# Patient Record
Sex: Female | Born: 1990 | Race: Black or African American | Hispanic: No | Marital: Single | Smoking: Never smoker
Health system: Southern US, Community
[De-identification: ages and names within clinical notes are randomized; demographics above are authoritative.]

## PROBLEM LIST (undated history)

## (undated) ENCOUNTER — Emergency Department (HOSPITAL_COMMUNITY): Discharge status: Absent without leave | Payer: Self-pay

## (undated) DIAGNOSIS — D649 Anemia, unspecified: Secondary | ICD-10-CM

---

## 2012-06-05 ENCOUNTER — Emergency Department (HOSPITAL_COMMUNITY)

## 2012-06-05 ENCOUNTER — Encounter (HOSPITAL_COMMUNITY): Payer: Self-pay | Admitting: *Deleted

## 2012-06-05 ENCOUNTER — Emergency Department (HOSPITAL_COMMUNITY)
Admission: EM | Admit: 2012-06-05 | Discharge: 2012-06-05 | Disposition: A | Discharge status: Home / Self Care | Attending: Emergency Medicine | Admitting: Emergency Medicine

## 2012-06-05 DIAGNOSIS — M6580 Other synovitis and tenosynovitis, unspecified site: Secondary | ICD-10-CM | POA: Insufficient documentation

## 2012-06-05 DIAGNOSIS — M76892 Other specified enthesopathies of left lower limb, excluding foot: Secondary | ICD-10-CM

## 2012-06-05 MED ORDER — NAPROXEN 500 MG PO TABS: 500.0000 mg | ORAL_TABLET | Freq: Two times a day (BID) | ORAL | Status: AC

## 2012-06-05 NOTE — Progress Notes (Signed)
Orthopedic Tech Progress Note Patient Details:  [place] 07-05-1991 [id number]  Ortho Devices Type of Ortho Device: Knee Sleeve Ortho Device/Splint Location: (L) LE Ortho Device/Splint Interventions: Application   Jennye Moccasin 06/05/2012, 4:45 PM

## 2012-06-05 NOTE — ED Notes (Signed)
Pt reports left knee pain since Saturday, denies any injury. Reports she works out a lot. ambulatory in triage.

## 2012-06-05 NOTE — ED Notes (Signed)
NAD upon discharge home. Discharge instructions reviewed, patient verbalizes understanding.

## 2012-06-05 NOTE — ED Provider Notes (Signed)
History   This chart was scribed for Judith Kras, MD by Charolett Bumpers . The patient was seen in room TR08C/TR08C. Patient's care was started at 1522.    CSN: [id number]  Arrival date & time 06/05/12  1434   First MD Initiated Contact with Patient 06/05/12 1522      Chief Complaint  Patient presents with  . Knee Pain    (Consider location/radiation/quality/duration/timing/severity/associated sxs/prior treatment) HPI Judith Bates is a 21 y.o. female who presents to the Emergency Department complaining of constant, moderate left knee pain that started hurting 4 days ago while working out. Pt denies any falls or known injuries. Pt reports that she took Ibuprofen with no relief yesterday. Pt denies any prior hx of knee pain or problems.   History reviewed. No pertinent past medical history.  History reviewed. No pertinent past surgical history.  History reviewed. No pertinent family history.  History  Substance Use Topics  . Smoking status: Never Smoker   . Smokeless tobacco: Not on file  . Alcohol Use: No    OB History    Grav Para Term Preterm Abortions TAB SAB Ect Mult Living                  Review of Systems  Constitutional: Negative for fever and chills.  Respiratory: Negative for shortness of breath.   Gastrointestinal: Negative for nausea and vomiting.  Musculoskeletal: Positive for arthralgias.       Left knee pain.   Neurological: Negative for weakness.  All other systems reviewed and are negative.    Allergies  Review of patient's allergies indicates no known allergies.  Home Medications   Current Outpatient Rx  Name Route Sig Dispense Refill  . NORETHIN ACE-ETH ESTRAD-FE 1-20 MG-MCG PO TABS Oral Take 1 tablet by mouth at bedtime.      BP 129/72  Pulse 80  Temp 98 F (36.7 C)  Resp 17  SpO2 100%  LMP 05/31/2012  Physical Exam  Nursing note and vitals reviewed. Constitutional: She appears well-developed and well-nourished. No distress.    HENT:  Head: Normocephalic and atraumatic.  Right Ear: External ear normal.  Left Ear: External ear normal.  Eyes: Conjunctivae are normal. Right eye exhibits no discharge. Left eye exhibits no discharge. No scleral icterus.  Neck: Neck supple. No tracheal deviation present.  Cardiovascular: Normal rate.   Pulmonary/Chest: Effort normal. No stridor. No respiratory distress.  Musculoskeletal: She exhibits tenderness. She exhibits no edema.       Mild tenderness to palpation of left lateral aspect of left knee. Distally neurovascularly intact. No effusions.   Neurological: She is alert. Cranial nerve deficit: no gross deficits.  Skin: Skin is warm and dry. No rash noted.  Psychiatric: She has a normal mood and affect.    ED Course  Procedures (including critical care time)  DIAGNOSTIC STUDIES: Oxygen Saturation is 100% on room air, normal by my interpretation.    COORDINATION OF CARE:  15:39-Discussed planned course of treatment with the patient including an x-ray, who is agreeable at this time.     Labs Reviewed - No data to display Dg Knee Complete 4 Views Left  06/05/2012  *RADIOLOGY REPORT*  Clinical Data: Knee pain  LEFT KNEE - COMPLETE 4+ VIEW  Comparison:  None.  Findings:  There is no evidence of fracture, dislocation, or joint effusion.  There is no evidence of arthropathy or other focal bone abnormality.  Soft tissues are unremarkable.  IMPRESSION: Negative.  Original Report Authenticated By: Camelia Phenes, M.D.       MDM  Patient's symptoms are most likely associated with tendinitis. She does exercise regularly. I instructed the patient to rest use anti-inflammatory agents and use the knee sleeve for comfort. Recommended she followup with an orthopedic doctor if the symptoms persist.  I personally performed the services described in this documentation, which was scribed in my presence.  The recorded information has been reviewed and considered.       Judith Kras, MD 06/05/12 [phone]

## 2013-06-24 ENCOUNTER — Emergency Department (HOSPITAL_COMMUNITY)
Admission: EM | Admit: 2013-06-24 | Discharge: 2013-06-24 | Disposition: A | Discharge status: Home / Self Care | Attending: Emergency Medicine | Admitting: Emergency Medicine

## 2013-06-24 ENCOUNTER — Encounter (HOSPITAL_COMMUNITY): Payer: Self-pay | Admitting: *Deleted

## 2013-06-24 DIAGNOSIS — Z79899 Other long term (current) drug therapy: Secondary | ICD-10-CM | POA: Insufficient documentation

## 2013-06-24 DIAGNOSIS — J029 Acute pharyngitis, unspecified: Secondary | ICD-10-CM | POA: Insufficient documentation

## 2013-06-24 DIAGNOSIS — R1310 Dysphagia, unspecified: Secondary | ICD-10-CM | POA: Insufficient documentation

## 2013-06-24 DIAGNOSIS — R51 Headache: Secondary | ICD-10-CM | POA: Insufficient documentation

## 2013-06-24 DIAGNOSIS — IMO0001 Reserved for inherently not codable concepts without codable children: Secondary | ICD-10-CM | POA: Insufficient documentation

## 2013-06-24 DIAGNOSIS — R599 Enlarged lymph nodes, unspecified: Secondary | ICD-10-CM | POA: Insufficient documentation

## 2013-06-24 DIAGNOSIS — H9209 Otalgia, unspecified ear: Secondary | ICD-10-CM | POA: Insufficient documentation

## 2013-06-24 MED ORDER — HYDROCODONE-ACETAMINOPHEN 7.5-325 MG/15ML PO SOLN: 15.0000 mL | Freq: Three times a day (TID) | ORAL | Status: DC | PRN

## 2013-06-24 NOTE — ED Provider Notes (Signed)
CSN: [id number]     Arrival date & time 06/24/13  1102 History  This chart was scribed for Judith Madura, PA, working with Toy Baker, MD by Blanchard Kelch, ED Scribe. This patient was seen in room TR05C/TR05C and the patient's care was started at 11:45 AM.    Chief Complaint  Patient presents with  . Sore Throat  . Otalgia    Patient is a 22 y.o. female presenting with pharyngitis. The history is provided by the patient. No language interpreter was used.  Sore Throat This is a new problem. The current episode started more than 2 days ago. The problem occurs constantly. Associated symptoms include headaches. Pertinent negatives include no shortness of breath. Treatments tried: Nyquil. The treatment provided no relief.    HPI Comments: Judith Bates is a 22 y.o. female who presents to the Emergency Department complaining of constant sore throat that began a week ago. She has been using Nyquil for the pain without relief. She complains of associated generalized myalgias, ear pain b/l, chills, headache, and discomfort when swallowing. She denies fever, congestion, rhinorrhea, discharge from ears, cough, nausea, vomiting, SOB, inability to swallow, drooling, or bleeding in mouth. She denies any sick contacts.   History reviewed. No pertinent past medical history. History reviewed. No pertinent past surgical history. No family history on file. History  Substance Use Topics  . Smoking status: Never Smoker   . Smokeless tobacco: Never Used  . Alcohol Use: No   OB History   Grav Para Term Preterm Abortions TAB SAB Ect Mult Living                 Review of Systems  Constitutional: Positive for chills. Negative for fever.  HENT: Positive for ear pain, sore throat and trouble swallowing. Negative for congestion, rhinorrhea and ear discharge.   Respiratory: Negative for cough and shortness of breath.   Gastrointestinal: Negative for nausea and vomiting.  Musculoskeletal: Positive for  myalgias.  Neurological: Positive for headaches.  All other systems reviewed and are negative.    Allergies  Review of patient's allergies indicates no known allergies.  Home Medications   Current Outpatient Rx  Name  Route  Sig  Dispense  Refill  . norethindrone-ethinyl estradiol (LOESTRIN FE 1/20) 1-20 MG-MCG tablet   Oral   Take 1 tablet by mouth at bedtime.         Marland Kitchen HYDROcodone-acetaminophen (HYCET) 7.5-325 mg/15 ml solution   Oral   Take 15 mLs by mouth every 8 (eight) hours as needed for pain.   120 mL   0    Triage Vitals: BP 104/66  Pulse 76  Temp(Src) 98.6 F (37 C) (Oral)  Resp 18  SpO2 99%  Physical Exam  Nursing note and vitals reviewed. Constitutional: She is oriented to person, place, and time. She appears well-developed and well-nourished. No distress.  HENT:  Head: Normocephalic and atraumatic.  Right Ear: Tympanic membrane, external ear and ear canal normal.  Left Ear: Tympanic membrane, external ear and ear canal normal.  Nose: Nose normal.  Mouth/Throat: Uvula is midline and mucous membranes are normal. No oral lesions. No trismus in the jaw. No edematous. Posterior oropharyngeal erythema present. No posterior oropharyngeal edema or tonsillar abscesses.  Bilateral tonsillar enlargement with erythema and mild exudates bilaterally. Airway patent and patient tolerating secretions without difficulty. Uvula midline.  Eyes: Conjunctivae and EOM are normal. Pupils are equal, round, and reactive to light. No scleral icterus.  Neck: Normal range of motion. Neck  supple.  No nuchal rigidity or meningeal signs  Cardiovascular: Normal rate, regular rhythm and normal heart sounds.   Pulmonary/Chest: Effort normal and breath sounds normal. No stridor. No respiratory distress. She has no wheezes. She has no rales.  Lungs clear to ausculation bilaterally.  Musculoskeletal: Normal range of motion.  Lymphadenopathy:    She has cervical adenopathy (anterior).   Neurological: She is alert and oriented to person, place, and time.  Skin: Skin is warm and dry. No rash noted. She is not diaphoretic. No erythema. No pallor.  Psychiatric: She has a normal mood and affect. Her behavior is normal.    ED Course  Procedures (including critical care time)  DIAGNOSTIC STUDIES: Oxygen Saturation is 99% on room air, normal by my interpretation.    COORDINATION OF CARE:  11:54 AM -Will order strep test. Patient verbalizes understanding and agrees with treatment plan.  Labs Review Labs Reviewed  RAPID STREP SCREEN  CULTURE, GROUP A STREP   Imaging Review No results found.  MDM   1. Viral pharyngitis    22 year old otherwise healthy female presents for sore throat times one week. Patient is well and nontoxic appearing on arrival, hemodynamically stable, and afebrile. Airway patent and patient tolerating secretions without difficulty on physical exam. Uvula midline without evidence of peritonsillar abscess. Rapid strep screen negative. Findings consistent with viral pharyngitis. Have recommended symptomatic treatment with Tylenol or ibuprofen as well as saltwater gargles. Patient given Hycet to use as needed for sore throat. Return precautions advised and patient agreeable to plan with no unaddressed concerns  I personally performed the services described in this documentation, which was scribed in my presence. The recorded information has been reviewed and is accurate.     Judith Madura, PA-C 06/24/13 1714

## 2013-06-24 NOTE — ED Notes (Signed)
Pt.has a one week c/o of Generalized body aches, off and on of feeling hot, ear pain, and sore throat. Pt. Is noted without n/v/d.  Pt. Reports swollen throat and white spots on the back of throat.

## 2013-06-26 LAB — CULTURE, GROUP A STREP

## 2013-06-27 NOTE — ED Provider Notes (Signed)
Medical screening examination/treatment/procedure(s) were performed by non-physician practitioner and as supervising physician I was immediately available for consultation/collaboration.  Toy Baker, MD 06/27/13 1800

## 2013-07-09 ENCOUNTER — Emergency Department (HOSPITAL_COMMUNITY)
Admission: EM | Admit: 2013-07-09 | Discharge: 2013-07-09 | Disposition: A | Discharge status: Home / Self Care | Attending: Emergency Medicine | Admitting: Emergency Medicine

## 2013-07-09 ENCOUNTER — Encounter (HOSPITAL_COMMUNITY): Payer: Self-pay | Admitting: Emergency Medicine

## 2013-07-09 DIAGNOSIS — L0591 Pilonidal cyst without abscess: Secondary | ICD-10-CM | POA: Insufficient documentation

## 2013-07-09 MED ORDER — OXYCODONE-ACETAMINOPHEN 5-325 MG PO TABS: 1.0000 | ORAL_TABLET | Freq: Four times a day (QID) | ORAL | Status: DC | PRN

## 2013-07-09 MED ORDER — LIDOCAINE-EPINEPHRINE 1 %-1:100000 IJ SOLN
20.0000 mL | Freq: Once | INTRAMUSCULAR | Status: AC
  Administered 2013-07-09: 20 mL

## 2013-07-09 MED ORDER — LIDOCAINE-EPINEPHRINE-TETRACAINE (LET) SOLUTION
3.0000 mL | Freq: Once | NASAL | Status: AC
  Administered 2013-07-09: 3 mL via TOPICAL
  Filled 2013-07-09: qty 3

## 2013-07-09 MED ORDER — OXYCODONE-ACETAMINOPHEN 5-325 MG PO TABS
1.0000 | ORAL_TABLET | Freq: Once | ORAL | Status: AC
  Administered 2013-07-09: 1 via ORAL
  Filled 2013-07-09: qty 1

## 2013-07-09 NOTE — ED Notes (Signed)
Clean dressing applied to incision. 2x2 guaze and tegaderm.

## 2013-07-09 NOTE — ED Provider Notes (Signed)
CSN: [id number]     Arrival date & time 07/09/13  4098 History   First MD Initiated Contact with Patient 07/09/13 0701     Chief Complaint  Patient presents with  . Tailbone Pain  . Abdominal Pain   (Consider location/radiation/quality/duration/timing/severity/associated sxs/prior Treatment) HPI  This a 22 year old female who presents with tailbone pain. Patient states that she woke up this morning with a sharp pain in her buttock. She states the pain radiates through her perineum.  She rates her pain is 6/10. It is worse with sitting. Patient states that she went on a long trip this past weekend and was in the car sitting for long time. She denies any fevers or injury to the site. She denies any other symptoms.  History reviewed. No pertinent past medical history. History reviewed. No pertinent past surgical history. No family history on file. History  Substance Use Topics  . Smoking status: Never Smoker   . Smokeless tobacco: Never Used  . Alcohol Use: No   OB History   Grav Para Term Preterm Abortions TAB SAB Ect Mult Living                 Review of Systems  Constitutional: Negative for fever.  Gastrointestinal: Negative for abdominal pain and rectal pain.  Musculoskeletal:       Tailbone pain    Allergies  Review of patient's allergies indicates no known allergies.  Home Medications   Current Outpatient Rx  Name  Route  Sig  Dispense  Refill  . HYDROcodone-acetaminophen (HYCET) 7.5-325 mg/15 ml solution   Oral   Take 15 mLs by mouth every 8 (eight) hours as needed for pain.   120 mL   0   . norethindrone-ethinyl estradiol (LOESTRIN FE 1/20) 1-20 MG-MCG tablet   Oral   Take 1 tablet by mouth at bedtime.         Marland Kitchen oxyCODONE-acetaminophen (PERCOCET/ROXICET) 5-325 MG per tablet   Oral   Take 1 tablet by mouth every 6 (six) hours as needed for pain.   10 tablet   0    BP 112/83  Pulse 70  Temp(Src) 98.1 F (36.7 C) (Oral)  Resp 18  Ht 5\' 4"  (1.626 m)   Wt 120 lb (54.432 kg)  BMI 20.59 kg/m2  SpO2 100%  LMP 06/09/2013 Physical Exam  Nursing note and vitals reviewed. Constitutional: She is oriented to person, place, and time. She appears well-developed and well-nourished.  HENT:  Head: Normocephalic and atraumatic.  Cardiovascular: Normal rate, regular rhythm and normal heart sounds.   No murmur heard. Pulmonary/Chest: Effort normal. No respiratory distress. She has no wheezes.  Abdominal: Soft. There is no tenderness.  Musculoskeletal:  There is a 2 cm area of induration and fluctuance just inferior to the gluteal cleft. No redness noted to the site. No spontaneous drainage noted.  Neurological: She is alert and oriented to person, place, and time.  Skin: Skin is warm and dry.  Psychiatric: She has a normal mood and affect.    ED Course  INCISION AND DRAINAGE Date/Time: 07/09/2013 9:16 AM Performed by: Ross Marcus, F Authorized by: Ross Marcus, F Consent: Verbal consent obtained. Risks and benefits: risks, benefits and alternatives were discussed Consent given by: patient Type: pilonidal cyst Body area: anogenital Location details: pilonidal Anesthesia: local infiltration Local anesthetic: lidocaine 2% with epinephrine and LET (lido,epi,tetracaine) Anesthetic total: 5 ml Patient sedated: no Scalpel size: 11 Incision type: single straight Complexity: simple Drainage: serosanguinous Drainage amount: scant Wound  treatment: drain placed Packing material: 1/4 in iodoform gauze Patient tolerance: Patient tolerated the procedure well with no immediate complications.   (including critical care time) Labs Review Labs Reviewed - No data to display Imaging Review No results found.  MDM   1. Pilonidal cyst    This a 22 year old female who presents with tailbone pain. Pain started this morning. She is nontoxic-appearing on exam and vital signs are reassuring. Patient has evidence of a small 2 cm area of induration  that is likely consistent with pilonidal cyst. This also is consistent with the patient's history. There is no obvious overlying infection. Patient was given by mouth pain medication. Procedure is as above. There was scant amount of serosanguineous drainage from the site. Packing was placed. Patient has student health followup.  She was instructed to be seen tomorrow for wound check.  After history, exam, and medical workup I feel the patient has been appropriately medically screened and is safe for discharge home. Pertinent diagnoses were discussed with the patient. Patient was given return precautions.    Shon Baton, MD 07/09/13 [phone]

## 2013-07-09 NOTE — ED Notes (Signed)
EDP at bedside  

## 2013-07-09 NOTE — ED Notes (Signed)
Pt reports sharp pain in tailbone area that radiates to vaginal area/ lower right abdomen. Pt currently on period but states nothing abnormal with menstrual cycle other than the vaginal pain. Denies recent fall on tailbone. Hurts to sit.

## 2013-08-24 IMAGING — CR DG KNEE COMPLETE 4+V*L*
4 series · 4 of 4 positions shown · non-contrast
Comparison: None.

CLINICAL DATA: Knee pain

LEFT KNEE - COMPLETE 4+ VIEW

[t knee ap left]
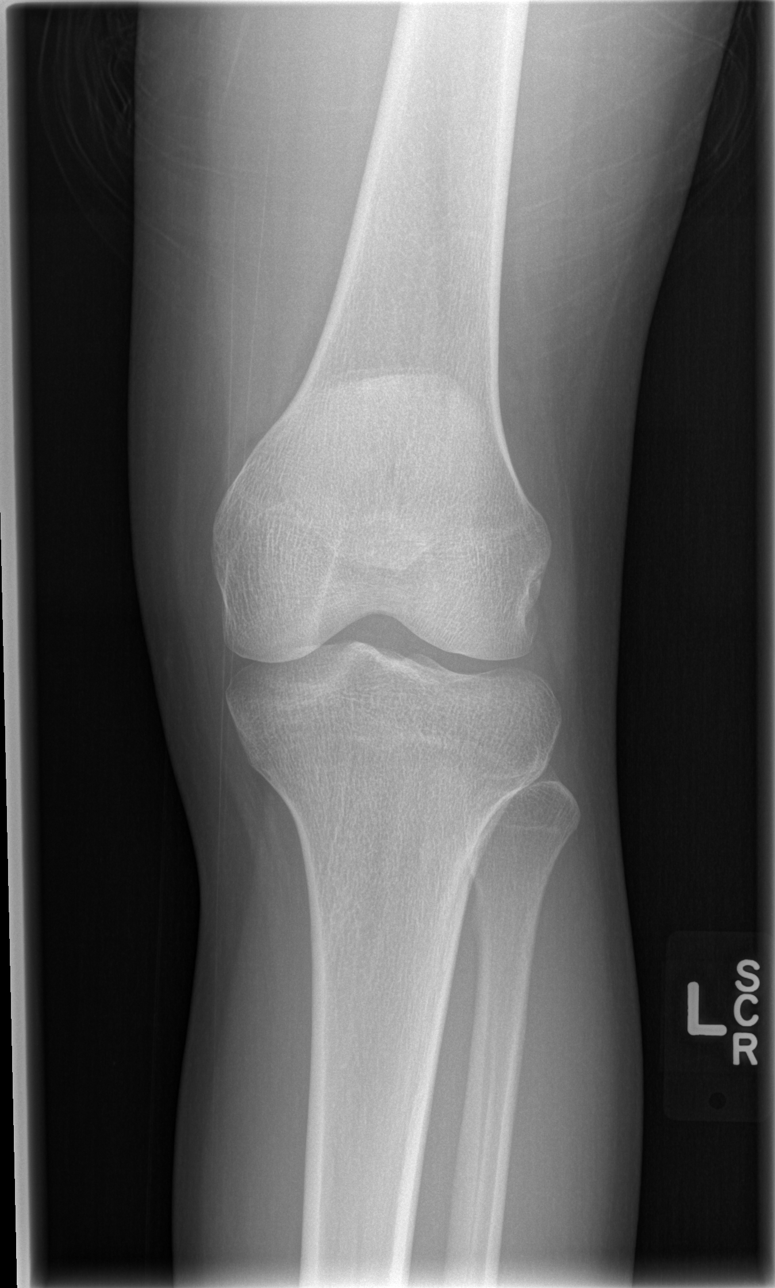

[t knee oblique left (1 of 2)]
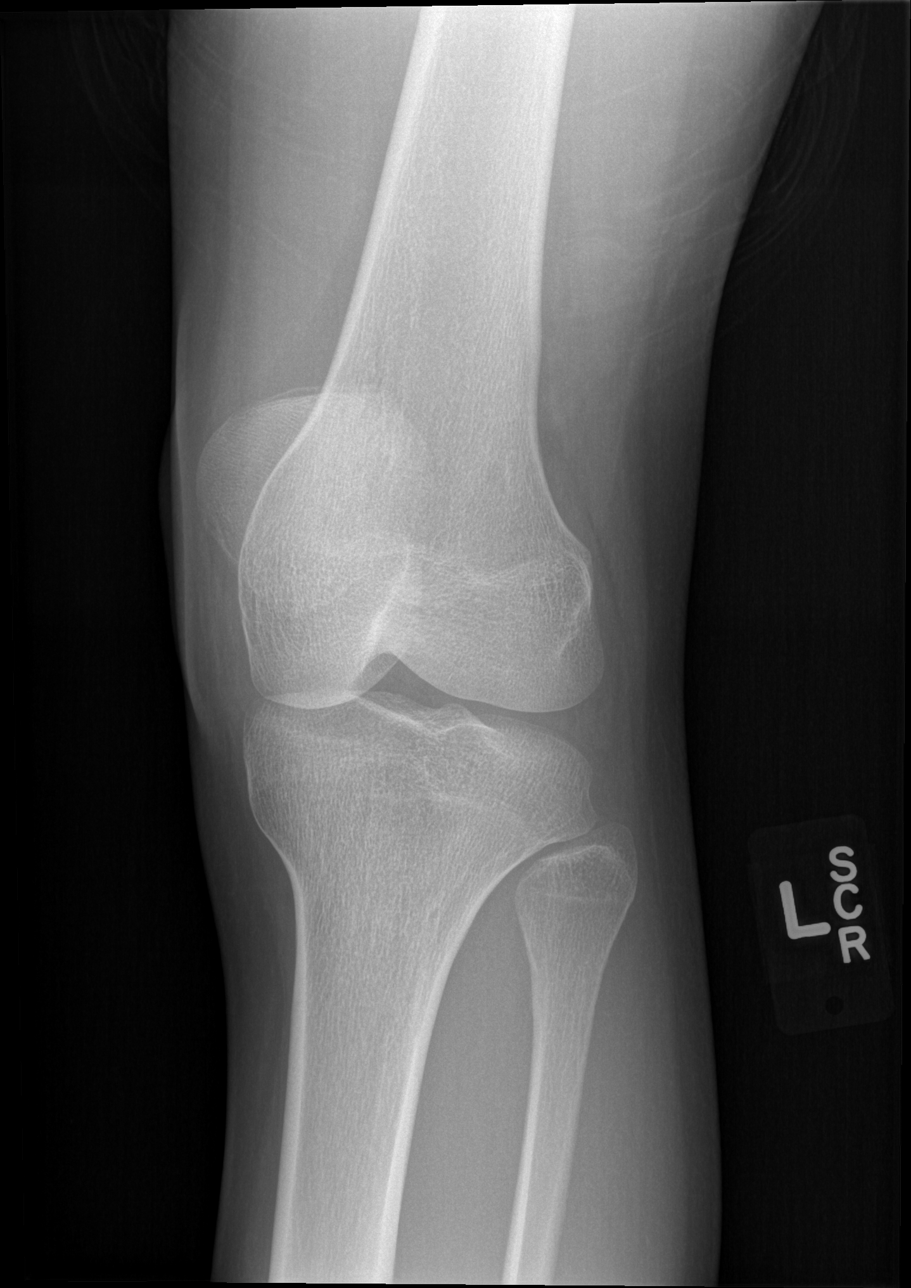

[t knee oblique left (2 of 2)]
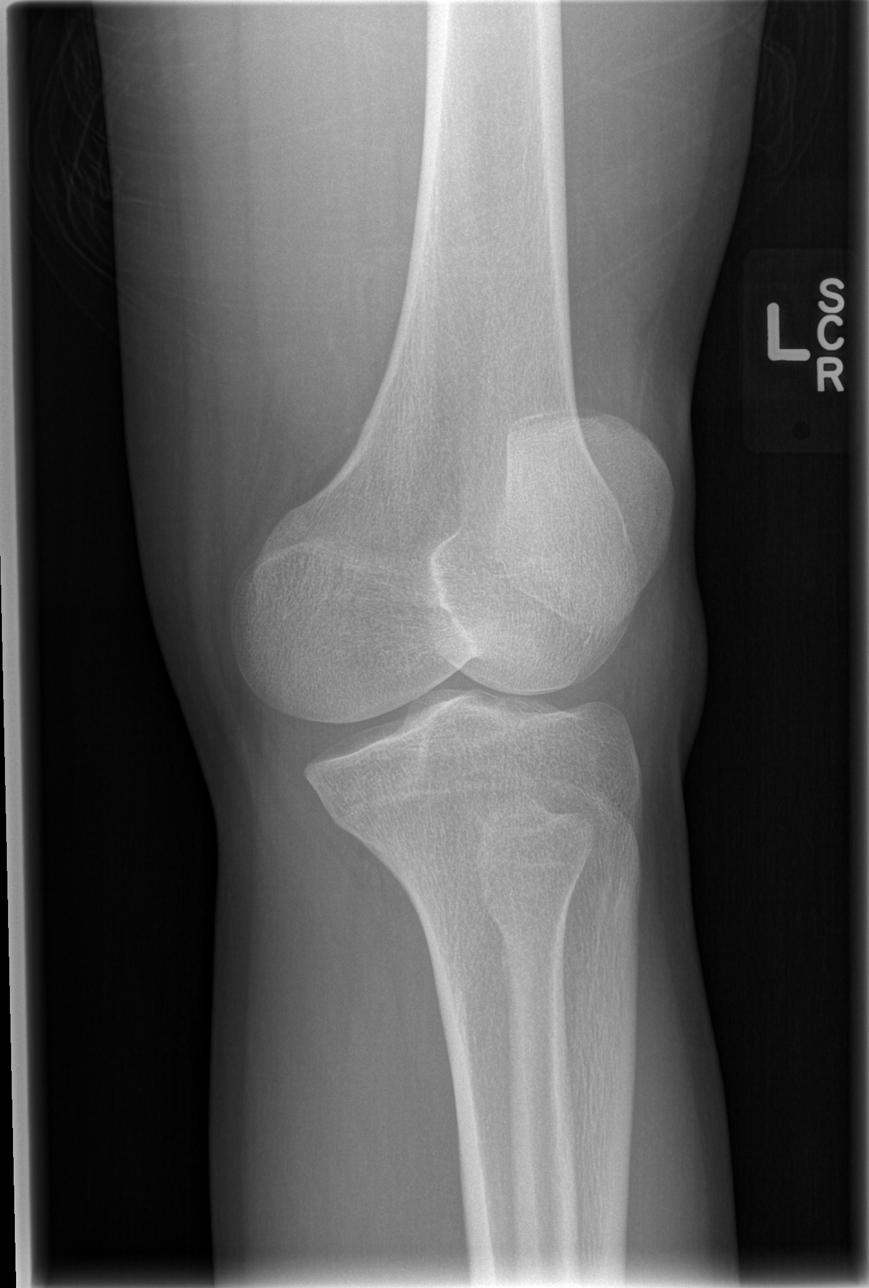

[t knee lat left]
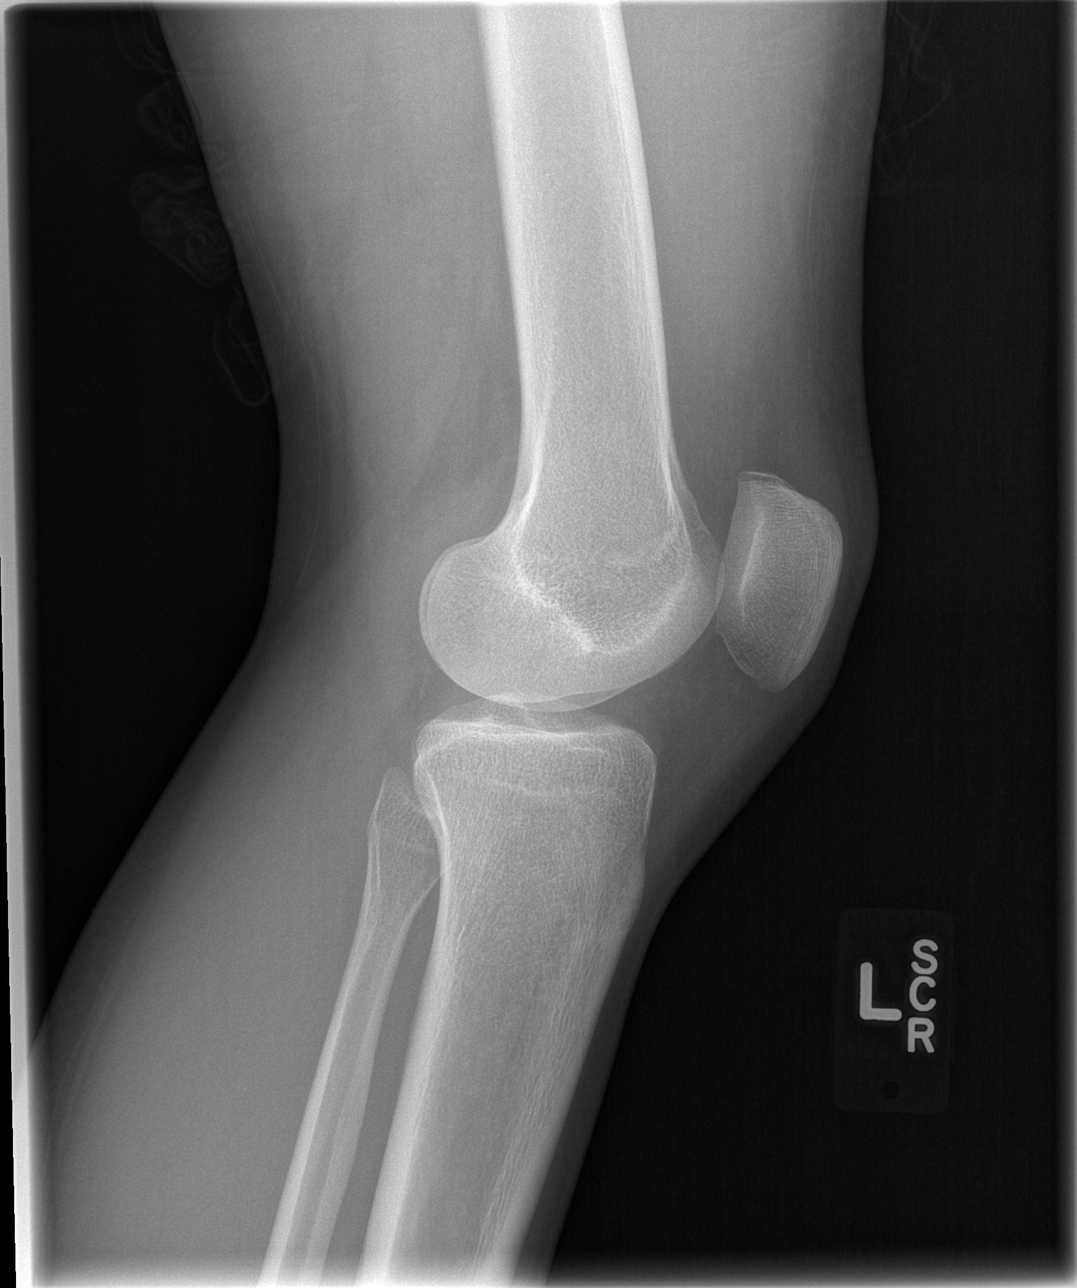

[4 of 4 positions shown; findings below may reference images not displayed]

FINDINGS: There is no evidence of fracture, dislocation, or joint
effusion.  There is no evidence of arthropathy or other focal bone
abnormality.  Soft tissues are unremarkable.
IMPRESSION: Negative.

## 2014-01-16 ENCOUNTER — Encounter (HOSPITAL_COMMUNITY): Payer: Self-pay | Admitting: Emergency Medicine

## 2014-01-16 ENCOUNTER — Emergency Department (HOSPITAL_COMMUNITY)
Admission: EM | Admit: 2014-01-16 | Discharge: 2014-01-16 | Discharge status: Against Medical Advice | Attending: Emergency Medicine | Admitting: Emergency Medicine

## 2014-01-16 DIAGNOSIS — Z3202 Encounter for pregnancy test, result negative: Secondary | ICD-10-CM | POA: Insufficient documentation

## 2014-01-16 DIAGNOSIS — R109 Unspecified abdominal pain: Secondary | ICD-10-CM | POA: Insufficient documentation

## 2014-01-16 LAB — URINALYSIS, ROUTINE W REFLEX MICROSCOPIC
BILIRUBIN URINE: NEGATIVE
GLUCOSE, UA: NEGATIVE mg/dL
HGB URINE DIPSTICK: NEGATIVE
Ketones, ur: 15 mg/dL — AB
Leukocytes, UA: NEGATIVE
Nitrite: NEGATIVE
PH: 5.5 (ref 5.0–8.0)
Protein, ur: NEGATIVE mg/dL
SPECIFIC GRAVITY, URINE: 1.037 — AB (ref 1.005–1.030)
Urobilinogen, UA: 0.2 mg/dL (ref 0.0–1.0)

## 2014-01-16 LAB — PREGNANCY, URINE: Preg Test, Ur: NEGATIVE

## 2014-01-16 NOTE — ED Notes (Signed)
Pt presents with Left groin pain starting yesterday after [facility] [employer]. Pt ambulatory but with increase pain

## 2014-01-17 ENCOUNTER — Encounter (HOSPITAL_COMMUNITY): Payer: Self-pay | Admitting: Emergency Medicine

## 2014-01-17 ENCOUNTER — Emergency Department (HOSPITAL_COMMUNITY)
Admission: EM | Admit: 2014-01-17 | Discharge: 2014-01-17 | Disposition: A | Discharge status: Home / Self Care | Attending: Emergency Medicine | Admitting: Emergency Medicine

## 2014-01-17 DIAGNOSIS — Y9302 Activity, running: Secondary | ICD-10-CM | POA: Insufficient documentation

## 2014-01-17 DIAGNOSIS — T148XXA Other injury of unspecified body region, initial encounter: Secondary | ICD-10-CM

## 2014-01-17 DIAGNOSIS — T733XXA Exhaustion due to excessive exertion, initial encounter: Secondary | ICD-10-CM | POA: Insufficient documentation

## 2014-01-17 DIAGNOSIS — Y9289 Other specified places as the place of occurrence of the external cause: Secondary | ICD-10-CM | POA: Insufficient documentation

## 2014-01-17 DIAGNOSIS — IMO0002 Reserved for concepts with insufficient information to code with codable children: Secondary | ICD-10-CM | POA: Insufficient documentation

## 2014-01-17 MED ORDER — CYCLOBENZAPRINE HCL 10 MG PO TABS: 10.0000 mg | ORAL_TABLET | Freq: Two times a day (BID) | ORAL | Status: DC | PRN

## 2014-01-17 MED ORDER — NAPROXEN 500 MG PO TABS: 500.0000 mg | ORAL_TABLET | Freq: Two times a day (BID) | ORAL | Status: DC

## 2014-01-17 NOTE — ED Provider Notes (Signed)
Medical screening examination/treatment/procedure(s) were performed by non-physician practitioner and as supervising physician I was immediately available for consultation/collaboration.   EKG Interpretation None       Doug SouSam Stavros Cail, MD 01/17/14 1725

## 2014-01-17 NOTE — ED Notes (Signed)
Per pt sts she has been having left groin pain after a 12 mile ruck march the other day. No other associated symptoms.

## 2014-01-17 NOTE — Discharge Instructions (Signed)
Take Naprosyn as needed for pain. Take Flexeril as needed for muscle soreness. You may take these medications together. Apply ice to the affected area for pain relief. Refer to attached documents for more information.

## 2014-01-17 NOTE — ED Provider Notes (Signed)
CSN: [id number]     Arrival date & time 01/17/14  0917 History  This chart was scribed for non-physician practitioner working with Doug SouSam Jacubowitz, MD by Judith Bates, ED scribe. This patient was seen in room TR08C/TR08C and the patient's care was started at 9:30 AM.   First MD Initiated Contact with Patient 01/17/14 [id number]     Chief Complaint  Patient presents with  . Groin Pain     (Consider location/radiation/quality/duration/timing/severity/associated sxs/prior Treatment) Patient is a 23 y.o. female presenting with groin pain. The history is provided by the patient and medical records. No language interpreter was used.  Groin Pain   HPI Comments: Judith Bates is a 23 y.o. female who presents to the Emergency Department complaining of left groin pain after [facility] two days ago. She was carrying a large bag throughout the run. Pt denies falling and injury. The pain is worse with elevating her leg. She has tried hot baths with epsom salt and ibuprofen ( two each day) with mild relief. Pt is able to walk, but reports having to walk much slower. Nothing seems to make her symptoms better.  History reviewed. No pertinent past medical history. History reviewed. No pertinent past surgical history. History reviewed. No pertinent family history. History  Substance Use Topics  . Smoking status: Never Smoker   . Smokeless tobacco: Never Used  . Alcohol Use: No   OB History   Grav Para Term Preterm Abortions TAB SAB Ect Mult Living                 Review of Systems  Musculoskeletal: Positive for arthralgias and myalgias.  All other systems reviewed and are negative.     Allergies  Review of patient's allergies indicates no known allergies.  Home Medications   Prior to Admission medications   Medication Sig Start Date End Date Taking? Authorizing Provider  HYDROcodone-acetaminophen (HYCET) 7.5-325 mg/15 ml solution Take 15 mLs by mouth every 8 (eight) hours as  needed for pain. 06/24/13   Antony MaduraKelly Humes, PA-C  norethindrone-ethinyl estradiol (LOESTRIN FE 1/20) 1-20 MG-MCG tablet Take 1 tablet by mouth at bedtime.    Historical Provider, MD  oxyCODONE-acetaminophen (PERCOCET/ROXICET) 5-325 MG per tablet Take 1 tablet by mouth every 6 (six) hours as needed for pain. 07/09/13   Shon Batonourtney F Horton, MD   BP 112/76  Pulse 75  Temp(Src) 98.3 F (36.8 C)  Resp 18  SpO2 98% Physical Exam  Nursing note and vitals reviewed. Constitutional: She is oriented to person, place, and time. She appears well-developed and well-nourished. No distress.  HENT:  Head: Normocephalic and atraumatic.  Eyes: EOM are normal. Pupils are equal, round, and reactive to light.  Neck: Normal range of motion. Neck supple. No tracheal deviation present.  Cardiovascular: Normal rate.   Pulmonary/Chest: Effort normal. No respiratory distress.  Abdominal: Soft. She exhibits no distension.  Musculoskeletal: She exhibits tenderness.  Left inner thigh tenderness to palpation over the gracilis muscles  No hip tenderness to palpation or hip instability    Neurological: She is alert and oriented to person, place, and time.  Sensation of bilateral legs intact  Skin: Skin is warm and dry.  Psychiatric: She has a normal mood and affect. Her behavior is normal.    ED Course  Procedures (including critical care time) DIAGNOSTIC STUDIES: Oxygen Saturation is 98% on room air, normal by my interpretation.    COORDINATION OF CARE:  9:35 AM Discussed course of care with pt  which includes Naprosyn and Flexeril. Pt understands and agrees.    Labs Review Labs Reviewed - No data to display  Imaging Review No results found.   EKG Interpretation None      MDM   Final diagnoses:  Muscle strain    Patient likely has a muscle strain from her ruck march. Patient will be discharged with naprosyn and Flexeril.   I personally performed the services described in this documentation, which  was scribed in my presence. The recorded information has been reviewed and is accurate.     Emilia BeckKaitlyn Laymon Stockert, [place] JerseyPA-C 01/17/14 [phone]

## 2014-09-21 ENCOUNTER — Encounter (HOSPITAL_COMMUNITY): Payer: Self-pay | Admitting: *Deleted

## 2014-09-21 ENCOUNTER — Inpatient Hospital Stay (HOSPITAL_COMMUNITY)
Admission: AD | Admit: 2014-09-21 | Discharge: 2014-09-21 | Disposition: A | Discharge status: Home / Self Care | Source: Ambulatory Visit | Attending: Obstetrics & Gynecology | Admitting: Obstetrics & Gynecology

## 2014-09-21 DIAGNOSIS — G43009 Migraine without aura, not intractable, without status migrainosus: Secondary | ICD-10-CM

## 2014-09-21 DIAGNOSIS — Z793 Long term (current) use of hormonal contraceptives: Secondary | ICD-10-CM | POA: Insufficient documentation

## 2014-09-21 DIAGNOSIS — Z3202 Encounter for pregnancy test, result negative: Secondary | ICD-10-CM | POA: Insufficient documentation

## 2014-09-21 HISTORY — DX: Anemia, unspecified: D64.9

## 2014-09-21 LAB — URINALYSIS, ROUTINE W REFLEX MICROSCOPIC
Bilirubin Urine: NEGATIVE
GLUCOSE, UA: NEGATIVE mg/dL
Hgb urine dipstick: NEGATIVE
Ketones, ur: NEGATIVE mg/dL
LEUKOCYTES UA: NEGATIVE
NITRITE: NEGATIVE
PH: 6 (ref 5.0–8.0)
Protein, ur: NEGATIVE mg/dL
SPECIFIC GRAVITY, URINE: 1.02 (ref 1.005–1.030)
Urobilinogen, UA: 0.2 mg/dL (ref 0.0–1.0)

## 2014-09-21 LAB — POCT PREGNANCY, URINE: PREG TEST UR: NEGATIVE

## 2014-09-21 MED ORDER — SUMATRIPTAN SUCCINATE 100 MG PO TABS: 100.0000 mg | ORAL_TABLET | ORAL | Status: AC | PRN

## 2014-09-21 MED ORDER — NORGESTIMATE-ETH ESTRADIOL 0.25-35 MG-MCG PO TABS: 1.0000 | ORAL_TABLET | Freq: Every day | ORAL | Status: DC

## 2014-09-21 MED ORDER — NORGESTIMATE-ETH ESTRADIOL 0.25-35 MG-MCG PO TABS: 1.0000 | ORAL_TABLET | Freq: Every day | ORAL | Status: AC

## 2014-09-21 NOTE — Discharge Instructions (Signed)
Migraine Headache A migraine headache is very bad, throbbing pain on one or both sides of your head. Talk to your doctor about what things may bring on (trigger) your migraine headaches. HOME CARE  Only take medicines as told by your doctor.  Lie down in a dark, quiet room when you have a migraine.  Keep a journal to find out if certain things bring on migraine headaches. For example, write down:  What you eat and drink.  How much sleep you get.  Any change to your diet or medicines.  Lessen how much alcohol you drink.  Quit smoking if you smoke.  Get enough sleep.  Lessen any stress in your life.  Keep lights dim if bright lights bother you or make your migraines worse. GET HELP RIGHT AWAY IF:   Your migraine becomes really bad.  You have a fever.  You have a stiff neck.  You have trouble seeing.  Your muscles are weak, or you lose muscle control.  You lose your balance or have trouble walking.  You feel like you will pass out (faint), or you pass out.  You have really bad symptoms that are different than your first symptoms. MAKE SURE YOU:   Understand these instructions.  Will watch your condition.  Will get help right away if you are not doing well or get worse. Document Released: 06/27/2008 Document Revised: 12/11/2011 Document Reviewed: 05/26/2013 [facility] Patient Information 2015 [place], [place]. This information is not intended to replace advice given to you by your health care provider. Make sure you discuss any questions you have with your health care provider. Contraception Choices Birth control (contraception) is the use of any methods or devices to stop pregnancy from happening. Below are some methods to help avoid pregnancy. HORMONAL BIRTH CONTROL  A small tube put under the skin of the upper arm (implant). The tube can stay in place for 3 years. The implant must be taken out after 3 years.  Shots given every 3 months.  Pills taken every  day.  Patches that are changed once a week.  A ring put into the vagina (vaginal ring). The ring is left in place for 3 weeks and removed for 1 week. Then, a new ring is put in the vagina.  Emergency birth control pills taken after unprotected sex (intercourse). BARRIER BIRTH CONTROL   A thin covering worn on the penis (female condom) during sex.  A soft, loose covering put into the vagina (female condom) before sex.  A rubber bowl that sits over the cervix (diaphragm). The bowl must be made for you. The bowl is put into the vagina before sex. The bowl is left in place for 6 to 8 hours after sex.  A small, soft cup that fits over the cervix (cervical cap). The cup must be made for you. The cup can be left in place for 48 hours after sex.  A sponge that is put into the vagina before sex.  A chemical that kills or stops sperm from getting into the cervix and uterus (spermicide). The chemical may be a cream, jelly, foam, or pill. INTRAUTERINE (IUD) BIRTH CONTROL   IUD birth control is a small, T-shaped piece of plastic. The plastic is put inside the uterus. There are 2 types of IUD:  Copper IUD. The IUD is covered in copper wire. The copper makes a fluid that kills sperm. It can stay in place for 10 years.  Hormone IUD. The hormone stops pregnancy from happening. It  can stay in place for 5 years. PERMANENT METHODS  When the woman has her fallopian tubes sealed, tied, or blocked during surgery. This stops the egg from traveling to the uterus.  The doctor places a small coil or insert into each fallopian tube. This causes scar tissue to form and blocks the fallopian tubes.  When the female has the tubes that carry sperm tied off (vasectomy). NATURAL FAMILY PLANNING BIRTH CONTROL   Natural family planning means not having sex or using barrier birth control on the days the woman could become pregnant.  Use a calendar to keep track of the length of each period and know the days she can get  pregnant.  Avoid sex during ovulation.  Use a thermometer to measure body temperature. Also watch for symptoms of ovulation.  Time sex to be after the woman has ovulated. Use condoms to help protect yourself against sexually transmitted infections (STIs). Do this no matter what type of birth control you use. Talk to your doctor about which type of birth control is best for you. Document Released: 07/16/2009 Document Revised: 09/23/2013 Document Reviewed: 04/09/2013 [facility] Patient Information 2015 [place], [place]. This information is not intended to replace advice given to you by your health care provider. Make sure you discuss any questions you have with your health care provider.

## 2014-09-21 NOTE — MAU Provider Note (Signed)
History     CSN: [id number]  Arrival date and time: 09/21/14 1521   First Provider Initiated Contact with Patient 09/21/14 1625      Chief Complaint  Patient presents with  . Headache   HPI Comments: [place] Rust 23 y.o. [id number] presents to MAU with two issues, first is possible pregnancy and second is headache. Her LMP was 09/13/14 and she had unprotected intercourse and took Plan B on 09/18/14. She threw it up after about 20-30 minutes. Since then she has had headache that is consistant with migraine without aura. She would like to restart her BCPs as she will be graduating form college in May and joining the [employer].   Headache  Associated symptoms include blurred vision, dizziness, nausea and vomiting.      Past Medical History  Diagnosis Date  . Anemia     History reviewed. No pertinent past surgical history.  History reviewed. No pertinent family history.  History  Substance Use Topics  . Smoking status: Never Smoker   . Smokeless tobacco: Never Used  . Alcohol Use: No    Allergies: No Known Allergies  Prescriptions prior to admission  Medication Sig Dispense Refill Last Dose  . levonorgestrel (PLAN B,NEXT CHOICE) 0.75 MG tablet Take 0.75 mg by mouth every 12 (twelve) hours.   09/18/2014  . cyclobenzaprine (FLEXERIL) 10 MG tablet Take 1 tablet (10 mg total) by mouth 2 (two) times daily as needed for muscle spasms. (Patient not taking: Reported on 09/21/2014) 20 tablet 0 Not Taking at Unknown time  . naproxen (NAPROSYN) 500 MG tablet Take 1 tablet (500 mg total) by mouth 2 (two) times daily with a meal. (Patient not taking: Reported on 09/21/2014) 30 tablet 0 Not Taking at Unknown time    Review of Systems  Constitutional: Negative.   Eyes: Positive for blurred vision.  Respiratory: Negative.   Cardiovascular: Negative.   Gastrointestinal: Positive for nausea and vomiting.  Genitourinary: Negative.   Musculoskeletal: Negative.   Skin: Negative.   Neurological:  Positive for dizziness and headaches.  Psychiatric/Behavioral: Negative.    Physical Exam   Blood pressure 119/76, pulse 73, temperature 98.6 F (37 C), temperature source Oral, resp. rate 18, height 5\' 5"  (1.651 m), weight 56.881 kg (125 lb 6.4 oz), last menstrual period 09/13/2014.  Physical Exam  Constitutional: She is oriented to person, place, and time. She appears well-developed and well-nourished. No distress.  HENT:  Head: Normocephalic and atraumatic.  Cardiovascular: Normal rate, regular rhythm and normal heart sounds.   Respiratory: Effort normal and breath sounds normal. No respiratory distress. She has no wheezes.  GI: Soft. Bowel sounds are normal. She exhibits no distension. There is no tenderness. There is no rebound.  Musculoskeletal: Normal range of motion.  Neurological: She is alert and oriented to person, place, and time.  Skin: Skin is warm and dry.  Psychiatric: She has a normal mood and affect. Her behavior is normal. Thought content normal.   Results for orders placed or performed during the hospital encounter of 09/21/14 (from the past 24 hour(s))  Urinalysis, Routine w reflex microscopic     Status: None   Collection Time: 09/21/14  4:38 PM  Result Value Ref Range   Color, Urine YELLOW YELLOW   APPearance CLEAR CLEAR   Specific Gravity, Urine 1.020 1.005 - 1.030   pH 6.0 5.0 - 8.0   Glucose, UA NEGATIVE NEGATIVE mg/dL   Hgb urine dipstick NEGATIVE NEGATIVE   Bilirubin Urine NEGATIVE NEGATIVE   Ketones,  ur NEGATIVE NEGATIVE mg/dL   Protein, ur NEGATIVE NEGATIVE mg/dL   Urobilinogen, UA 0.2 0.0 - 1.0 mg/dL   Nitrite NEGATIVE NEGATIVE   Leukocytes, UA NEGATIVE NEGATIVE  Pregnancy, urine POC     Status: None   Collection Time: 09/21/14  4:49 PM  Result Value Ref Range   Preg Test, Ur NEGATIVE NEGATIVE   . MAU Course  Procedures  MDM   Assessment and Plan   A: Migraine without Aura Contraception management  P:  OrthoCyclen BCPs/ 3 packs/  condoms for one pack Advised to find GYN for care Imitrex 100 mg po prn migraine May return to MAU as needed  Carolynn ServeBarefoot, Yacine Droz Miller 09/21/2014, 5:36 PM

## 2014-09-21 NOTE — MAU Note (Signed)
Pt took Plan B on Friday & vomited within 20-30 minutes.  Has had HA since Friday, has had several loose stools today.  Denies abd pain.

## 2015-07-13 ENCOUNTER — Encounter (HOSPITAL_COMMUNITY): Payer: Self-pay | Admitting: *Deleted

## 2015-07-13 ENCOUNTER — Emergency Department (HOSPITAL_COMMUNITY)
Admission: EM | Admit: 2015-07-13 | Discharge: 2015-07-13 | Disposition: A | Discharge status: Home / Self Care | Attending: Emergency Medicine | Admitting: Emergency Medicine

## 2015-07-13 DIAGNOSIS — M62830 Muscle spasm of back: Secondary | ICD-10-CM | POA: Insufficient documentation

## 2015-07-13 DIAGNOSIS — Z793 Long term (current) use of hormonal contraceptives: Secondary | ICD-10-CM | POA: Diagnosis not present

## 2015-07-13 DIAGNOSIS — Y92410 Unspecified street and highway as the place of occurrence of the external cause: Secondary | ICD-10-CM | POA: Diagnosis not present

## 2015-07-13 DIAGNOSIS — M545 Low back pain, unspecified: Secondary | ICD-10-CM

## 2015-07-13 DIAGNOSIS — Y998 Other external cause status: Secondary | ICD-10-CM | POA: Insufficient documentation

## 2015-07-13 DIAGNOSIS — Y9389 Activity, other specified: Secondary | ICD-10-CM | POA: Insufficient documentation

## 2015-07-13 DIAGNOSIS — Z862 Personal history of diseases of the blood and blood-forming organs and certain disorders involving the immune mechanism: Secondary | ICD-10-CM | POA: Insufficient documentation

## 2015-07-13 DIAGNOSIS — S3992XA Unspecified injury of lower back, initial encounter: Secondary | ICD-10-CM | POA: Diagnosis present

## 2015-07-13 MED ORDER — CYCLOBENZAPRINE HCL 10 MG PO TABS: 10.0000 mg | ORAL_TABLET | Freq: Two times a day (BID) | ORAL | Status: AC | PRN

## 2015-07-13 MED ORDER — NAPROXEN 500 MG PO TABS: 500.0000 mg | ORAL_TABLET | Freq: Two times a day (BID) | ORAL | Status: AC

## 2015-07-13 NOTE — Discharge Instructions (Signed)
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
## 2017-10-16 ENCOUNTER — Telehealth: Payer: Self-pay

## 2018-01-16 ENCOUNTER — Encounter: Payer: Self-pay | Admitting: *Deleted

## 2018-01-16 NOTE — Progress Notes (Signed)
This encounter was created in error - please disregard.
